# Patient Record
Sex: Female | Born: 1970 | Race: White | Hispanic: No | Smoking: Current every day smoker
Health system: Southern US, Community
[De-identification: ages and names within clinical notes are randomized; demographics above are authoritative.]

---

## 2000-01-13 ENCOUNTER — Other Ambulatory Visit: Admission: RE | Admit: 2000-01-13 | Discharge: 2000-01-13 | Payer: Self-pay | Admitting: Obstetrics and Gynecology

## 2001-10-08 ENCOUNTER — Other Ambulatory Visit: Admission: RE | Admit: 2001-10-08 | Discharge: 2001-10-08 | Payer: Self-pay | Admitting: Obstetrics and Gynecology

## 2002-11-23 ENCOUNTER — Emergency Department (HOSPITAL_COMMUNITY): Admission: EM | Admit: 2002-11-23 | Discharge: 2002-11-23 | Payer: Self-pay | Admitting: Emergency Medicine

## 2003-09-19 ENCOUNTER — Other Ambulatory Visit: Admission: RE | Admit: 2003-09-19 | Discharge: 2003-09-19 | Payer: Self-pay | Admitting: Obstetrics and Gynecology

## 2009-01-07 ENCOUNTER — Other Ambulatory Visit: Admission: RE | Admit: 2009-01-07 | Discharge: 2009-01-07 | Payer: Self-pay | Admitting: Emergency Medicine

## 2014-07-18 ENCOUNTER — Other Ambulatory Visit: Payer: Self-pay | Admitting: Infectious Disease

## 2014-07-18 ENCOUNTER — Ambulatory Visit
Admission: RE | Admit: 2014-07-18 | Discharge: 2014-07-18 | Disposition: A | Discharge status: Home / Self Care | Payer: No Typology Code available for payment source | Source: Ambulatory Visit | Attending: Infectious Disease | Admitting: Infectious Disease

## 2014-07-18 DIAGNOSIS — R7611 Nonspecific reaction to tuberculin skin test without active tuberculosis: Secondary | ICD-10-CM

## 2015-11-20 IMAGING — CR DG CHEST 1V
1 series · 1 of 1 positions shown · non-contrast
Comparison: None.

CLINICAL DATA: Positive PPD

EXAM:
CHEST - 1 VIEW

[view not recorded]
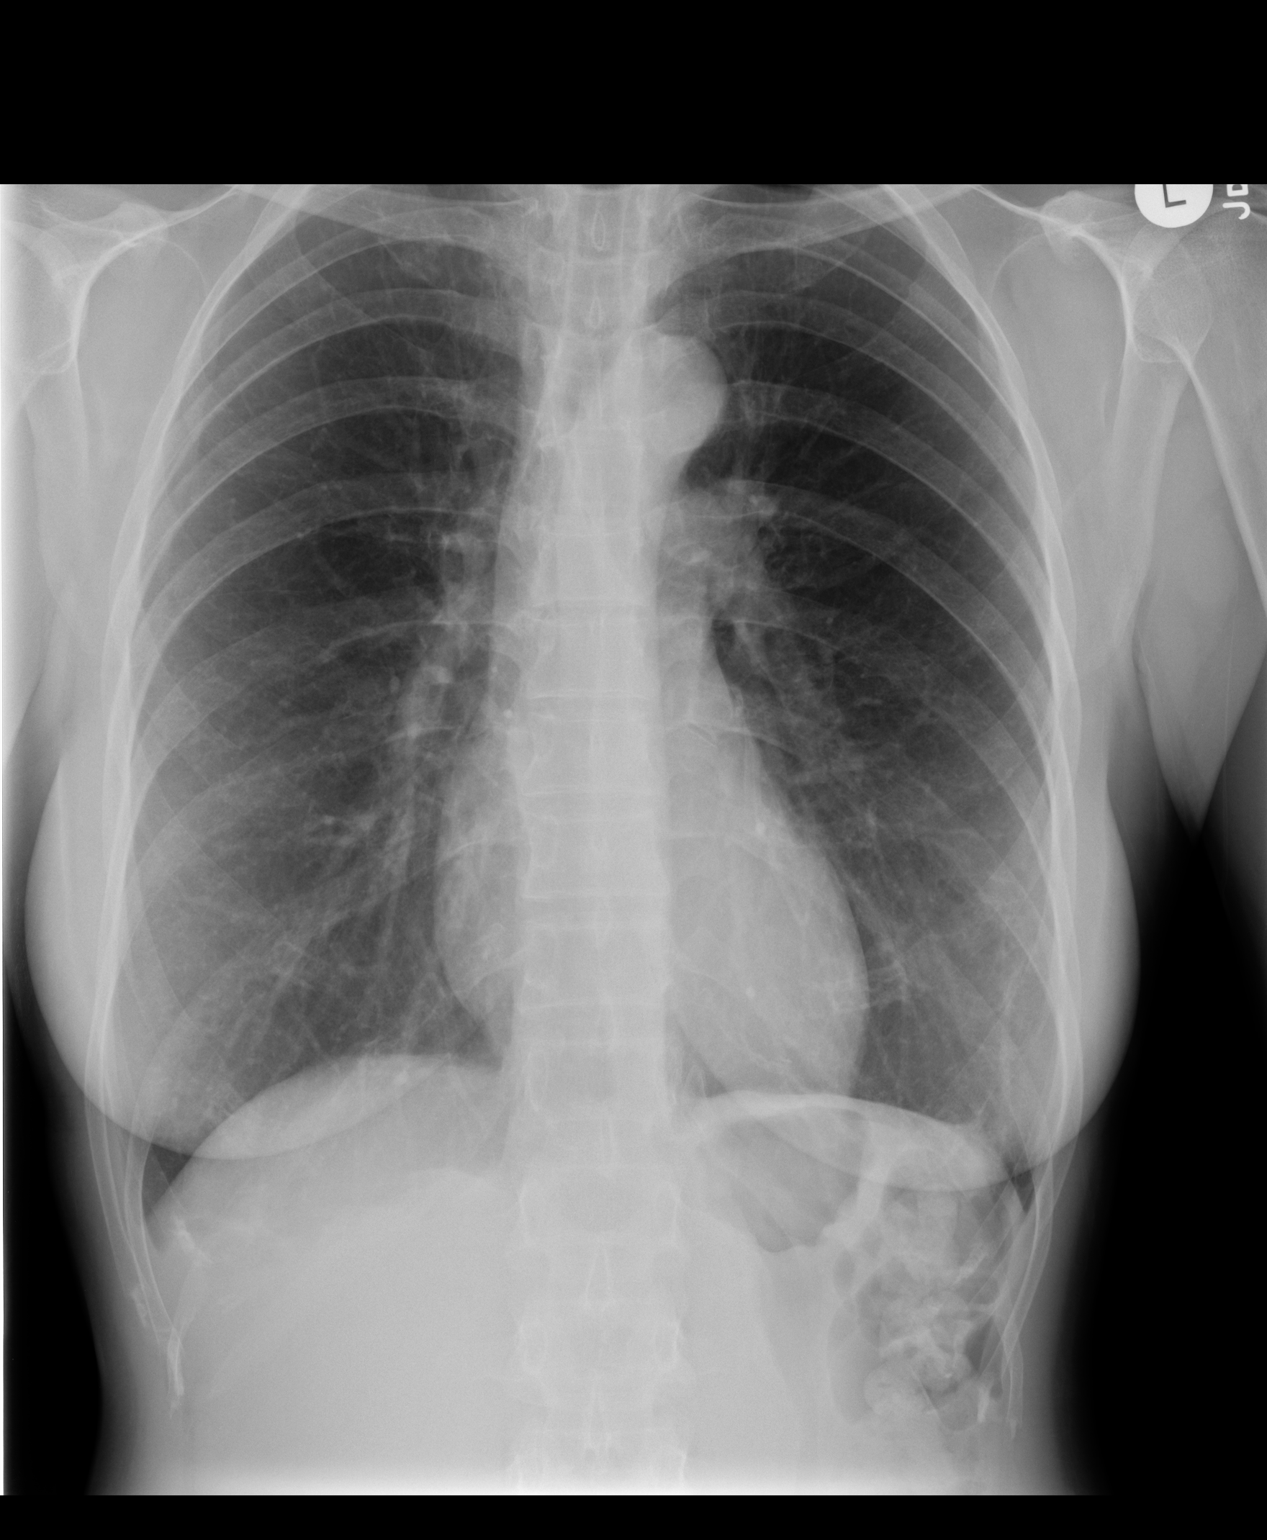

[1 of 1 positions shown; findings below may reference images not displayed]

FINDINGS: The heart size and mediastinal contours are within normal limits.
Both lungs are clear. The visualized skeletal structures are
unremarkable.
IMPRESSION: No active disease.

## 2015-12-23 ENCOUNTER — Ambulatory Visit: Payer: Worker's Compensation | Admitting: Physical Therapy

## 2015-12-24 ENCOUNTER — Telehealth: Payer: Self-pay

## 2015-12-24 NOTE — Telephone Encounter (Signed)
12/24/15 [name] visits

## 2015-12-25 ENCOUNTER — Ambulatory Visit: Payer: Worker's Compensation | Admitting: Physical Therapy

## 2017-08-21 ENCOUNTER — Other Ambulatory Visit: Payer: Self-pay

## 2017-08-21 ENCOUNTER — Ambulatory Visit: Payer: Managed Care, Other (non HMO) | Admitting: Physician Assistant

## 2017-08-21 ENCOUNTER — Encounter: Payer: Self-pay | Admitting: Physician Assistant

## 2017-08-21 VITALS — BP 126/76 | HR 76 | Temp 98.2°F | Resp 16 | Ht 66.0 in | Wt 126.0 lb

## 2017-08-21 DIAGNOSIS — R059 Cough, unspecified: Secondary | ICD-10-CM

## 2017-08-21 DIAGNOSIS — R52 Pain, unspecified: Secondary | ICD-10-CM

## 2017-08-21 DIAGNOSIS — R05 Cough: Secondary | ICD-10-CM | POA: Diagnosis not present

## 2017-08-21 DIAGNOSIS — R0981 Nasal congestion: Secondary | ICD-10-CM

## 2017-08-21 DIAGNOSIS — J029 Acute pharyngitis, unspecified: Secondary | ICD-10-CM

## 2017-08-21 DIAGNOSIS — R112 Nausea with vomiting, unspecified: Secondary | ICD-10-CM

## 2017-08-21 DIAGNOSIS — R6889 Other general symptoms and signs: Secondary | ICD-10-CM

## 2017-08-21 DIAGNOSIS — J069 Acute upper respiratory infection, unspecified: Secondary | ICD-10-CM

## 2017-08-21 LAB — POC INFLUENZA A&B (BINAX/QUICKVUE)
Influenza A, POC: NEGATIVE
Influenza B, POC: NEGATIVE

## 2017-08-21 MED ORDER — AZELASTINE HCL 0.1 % NA SOLN: 2.0000 | Freq: Two times a day (BID) | NASAL | 0 refills | Status: AC

## 2017-08-21 MED ORDER — BENZONATATE 100 MG PO CAPS: 100.0000 mg | ORAL_CAPSULE | Freq: Three times a day (TID) | ORAL | 0 refills | Status: AC | PRN

## 2017-08-21 MED ORDER — HYDROCODONE-HOMATROPINE 5-1.5 MG/5ML PO SYRP: 5.0000 mL | ORAL_SOLUTION | Freq: Three times a day (TID) | ORAL | 0 refills | Status: AC | PRN

## 2017-08-21 MED ORDER — BENZOCAINE-MENTHOL 15-3.6 MG MT LOZG: 1.0000 | LOZENGE | OROMUCOSAL | 0 refills | Status: AC | PRN

## 2017-08-21 NOTE — Progress Notes (Addendum)
MRN: [id number] DOB: 1970/09/18  Subjective:   Heidi Jones is a 47 y.o. female presenting for chief complaint of Cough (x 2 days, no mucus); body hurts; and throwing up (this am, "upset stomach on yesterday") .  Reports 2 day history of sudden onset of body aches, subjective fever,  fatigue, dry cough, nasal congestion, ear fullness, and sore throat. 2 episodes of vomiting today. Denies hemoptysis, hematemesis, wheezing, shortness of breath, chest pain, nausea, abdominal pain and diarrhea. Last BM was last night. Has tried tea with no relief. Has had contact with coworkers. Since 2 episodes of vomiting this morning, she has been able to tolerate light meals and water.  No history of seasonal allergies, no history of asthma or COPD. Patient has not  flu shot this season. Smokes 1 ppd x 30+ years. Denies any other aggravating or relieving factors, no other questions or concerns.  Heidi Jones currently has no medications in their medication list. Also has No Known Allergies.  Heidi Jones  has no past medical history on file. Also  has no past surgical history on file.   Objective:   Vitals: BP 126/76   Pulse 76   Temp 98.2 F (36.8 C) (Oral)   Resp 16   Ht 5\' 6"  (1.676 m)   Wt 126 lb (57.2 kg) Comment: pt didn't want to take off boots  LMP 06/07/2017   SpO2 96%   BMI 20.34 kg/m   Physical Exam  Constitutional: She is oriented to person, place, and time. She appears well-developed and well-nourished. No distress.  Appears like she does not feel well.    HENT:  Head: Normocephalic and atraumatic.  Right Ear: Tympanic membrane, external ear and ear canal normal.  Left Ear: Tympanic membrane, external ear and ear canal normal.  Nose: Mucosal edema ( moderate bilaterally) present. Right sinus exhibits no maxillary sinus tenderness and no frontal sinus tenderness. Left sinus exhibits no maxillary sinus tenderness and no frontal sinus tenderness.  Mouth/Throat: Uvula is midline and  mucous membranes are normal. Posterior oropharyngeal erythema present. Tonsils are 1+ on the right. Tonsils are 1+ on the left. No tonsillar exudate.  Eyes: Conjunctivae are normal.  Neck: Normal range of motion.  Cardiovascular: Normal rate, regular rhythm and normal heart sounds.  Pulmonary/Chest: Effort normal and breath sounds normal. She has no decreased breath sounds. She has no wheezes. She has no rhonchi. She has no rales.  Abdominal: Soft. Normal appearance and bowel sounds are normal. There is no tenderness. There is no rigidity, no rebound, no guarding, no CVA tenderness, no tenderness at McBurney's point and negative Murphy's sign.  Lymphadenopathy:       Head (right side): No submental, no submandibular, no tonsillar, no preauricular, no posterior auricular and no occipital adenopathy present.       Head (left side): No submental, no submandibular, no tonsillar, no preauricular, no posterior auricular and no occipital adenopathy present.    She has no cervical adenopathy.       Right: No supraclavicular adenopathy present.       Left: No supraclavicular adenopathy present.  Neurological: She is alert and oriented to person, place, and time.  Skin: Skin is warm and dry.  Psychiatric: She has a normal mood and affect.  Vitals reviewed.   Results for orders placed or performed in visit on 08/21/17 (from the past 24 hour(s))  POC Influenza A&B (Binax test)     Status: Normal   Collection Time: 08/21/17  5:39 PM  Result Value Ref Range   Influenza A, POC Negative Negative   Influenza B, POC Negative Negative    Assessment and Plan :  1. Flu-like symptoms - POC Influenza A&B (Binax test) 2. Body aches 3. Cough - benzonatate (TESSALON) 100 MG capsule; Take 1-2 capsules (100-200 mg total) by mouth 3 (three) times daily as needed for cough.  Dispense: 40 capsule; Refill: 0 - HYDROcodone-homatropine (HYCODAN) 5-1.5 MG/5ML syrup; Take 5 mLs by mouth every 8 (eight) hours as needed  for cough.  Dispense: 120 mL; Refill: 0 4. Nasal congestion - azelastine (ASTELIN) 0.1 % nasal spray; Place 2 sprays into both nostrils 2 (two) times daily. Use in each nostril as directed  Dispense: 30 mL; Refill: 0 5. Sore throat - Benzocaine-Menthol (CEPACOL SORE THROAT) 15-3.6 MG LOZG; Use as directed 1 lozenge in the mouth or throat every 2 (two) hours as needed.  Dispense: 16 each; Refill: 0 6. Non-intractable vomiting with nausea, unspecified vomiting type Resolved.  7. Acute upper respiratory infection History and physical exam findings suspicious for flulike illness.  Point-of-care flu test negative but this is consistent with viral etiology.  She appears like she does not feel well but is overall in no distress.  Vitals are stable.  She is afebrile.  Lungs are CTAB.  No abdominal pain noted on exam.  Recommend symptomatic treatment at this time.  Advised to return to clinic if symptoms worsen, do not improve, or as needed.    Benjiman Core, PA-C  [facility] at [facility] 08/21/2017 5:38 PM

## 2017-08-21 NOTE — Patient Instructions (Addendum)
- We will treat this as a respiratory viral infection.  - I recommend you rest, drink plenty of fluids, eat light bland meals including soups, rice, apples, bananas, and toast. - You may use cough syrup at night for your cough and sore throat, Tessalon pearls during the day. Be aware that cough syrup can definitely make you drowsy and sleepy so do not drive or operate any heavy machinery if it is affecting you during the day.  - You may also use Tylenol or ibuprofen over-the-counter or lozenges for your sore throat. Use nasal spray for your nasal congestion.  - Please let me know if you are not seeing any improvement or get worse in 7-10 days.    Influenza, Adult Influenza ("the flu") is an infection in the lungs, nose, and throat (respiratory tract). It is caused by a virus. The flu causes many common cold symptoms, as well as a high fever and body aches. It can make you feel very sick. The flu spreads easily from person to person (is contagious). Getting a flu shot (influenza vaccination) every year is the best way to prevent the flu. Follow these instructions at home:  Take over-the-counter and prescription medicines only as told by your doctor.  Use a cool mist humidifier to add moisture (humidity) to the air in your home. This can make it easier to breathe.  Rest as needed.  Drink enough fluid to keep your pee (urine) clear or pale yellow.  Cover your mouth and nose when you cough or sneeze.  Wash your hands with soap and water often, especially after you cough or sneeze. If you cannot use soap and water, use hand sanitizer.  Stay home from work or school as told by your doctor. Unless you are visiting your doctor, try to avoid leaving home until your fever has been gone for 24 hours without the use of medicine.  Keep all follow-up visits as told by your doctor. This is important. How is this prevented?  Getting a yearly (annual) flu shot is the best way to avoid getting the flu.  You may get the flu shot in late summer, fall, or winter. Ask your doctor when you should get your flu shot.  Wash your hands often or use hand sanitizer often.  Avoid contact with people who are sick during cold and flu season.  Eat healthy foods.  Drink plenty of fluids.  Get enough sleep.  Exercise regularly. Contact a doctor if:  You get new symptoms.  You have: ? Chest pain. ? Watery poop (diarrhea). ? A fever.  Your cough gets worse.  You start to have more mucus.  You feel sick to your stomach (nauseous).  You throw up (vomit). Get help right away if:  You start to be short of breath or have trouble breathing.  Your skin or nails turn a bluish color.  You have very bad pain or stiffness in your neck.  You get a sudden headache.  You get sudden pain in your face or ear.  You cannot stop throwing up. This information is not intended to replace advice given to you by your health care provider. Make sure you discuss any questions you have with your health care provider. Document Released: 03/29/2008 Document Revised: 11/26/2015 Document Reviewed: 04/14/2015 Elsevier Interactive Patient Education  2017 [employer].   IF you received an x-ray today, you will receive an invoice from [facility]. Please contact [facility] at [phone] with questions or concerns regarding your  invoice.   IF you received labwork today, you will receive an invoice from [facility]. Please contact [facility] at [phone] with questions or concerns regarding your invoice.   Our billing staff will not be able to assist you with questions regarding bills from these companies.  You will be contacted with the lab results as soon as they are available. The fastest way to get your results is to activate your My Chart account. Instructions are located on the last page of this paperwork. If you have not heard from us regarding the results in 2 weeks, please contact this  office.

## 2017-08-23 ENCOUNTER — Encounter: Payer: Self-pay | Admitting: *Deleted

## 2023-12-04 ENCOUNTER — Other Ambulatory Visit: Payer: Self-pay | Admitting: Obstetrics and Gynecology

## 2023-12-04 DIAGNOSIS — R928 Other abnormal and inconclusive findings on diagnostic imaging of breast: Secondary | ICD-10-CM

## 2023-12-14 ENCOUNTER — Ambulatory Visit: Payer: Self-pay

## 2023-12-14 ENCOUNTER — Ambulatory Visit
Admission: RE | Admit: 2023-12-14 | Discharge: 2023-12-14 | Disposition: A | Discharge status: Home / Self Care | Payer: Self-pay | Source: Ambulatory Visit | Attending: Obstetrics and Gynecology | Admitting: Obstetrics and Gynecology

## 2023-12-14 DIAGNOSIS — R928 Other abnormal and inconclusive findings on diagnostic imaging of breast: Secondary | ICD-10-CM
